# Patient Record
Sex: Male | Born: 2005 | Race: White | Hispanic: No | Marital: Single | Smoking: Never smoker
Health system: Southern US, Community
[De-identification: ages and names within clinical notes are randomized; demographics above are authoritative.]

## PROBLEM LIST (undated history)

## (undated) DIAGNOSIS — F840 Autistic disorder: Secondary | ICD-10-CM

## (undated) DIAGNOSIS — R569 Unspecified convulsions: Secondary | ICD-10-CM

## (undated) DIAGNOSIS — K5909 Other constipation: Secondary | ICD-10-CM

## (undated) HISTORY — DX: Other constipation: K59.09

## (undated) HISTORY — DX: Unspecified convulsions: R56.9

---
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
## 2024-02-05 ENCOUNTER — Encounter: Payer: Self-pay | Admitting: Emergency Medicine

## 2024-02-05 ENCOUNTER — Ambulatory Visit
Admission: EM | Admit: 2024-02-05 | Discharge: 2024-02-05 | Disposition: A | Discharge status: Home / Self Care | Payer: MEDICAID | Attending: Emergency Medicine | Admitting: Emergency Medicine

## 2024-02-05 DIAGNOSIS — J029 Acute pharyngitis, unspecified: Secondary | ICD-10-CM | POA: Diagnosis not present

## 2024-02-05 DIAGNOSIS — B349 Viral infection, unspecified: Secondary | ICD-10-CM

## 2024-02-05 LAB — POC SOFIA SARS ANTIGEN FIA: SARS Coronavirus 2 Ag: NEGATIVE

## 2024-02-05 LAB — POCT RAPID STREP A (OFFICE): Rapid Strep A Screen: NEGATIVE

## 2024-02-05 MED ORDER — BENZONATATE 100 MG PO CAPS: 100.0000 mg | ORAL_CAPSULE | Freq: Three times a day (TID) | ORAL | 0 refills | Status: DC

## 2024-02-05 MED ORDER — LIDOCAINE VISCOUS HCL 2 % MT SOLN: 15.0000 mL | OROMUCOSAL | 0 refills | Status: AC | PRN

## 2024-02-05 NOTE — Discharge Instructions (Signed)
 Your symptoms today are most likely being caused by a virus and should steadily improve in time it can take up to 7 to 10 days before you truly start to see a turnaround however things will get better  COVID and strep test negative  May gargle and spit lidocaine  solution every 4 hours as needed to provide temporary relief to the throat  May use Tessalon  pill every 8 hours as needed to help with cough    You can take Tylenol and/or Ibuprofen  as needed for fever reduction and pain relief.   For cough: honey 1/2 to 1 teaspoon (you can dilute the honey in water or another fluid).  You can also use guaifenesin and dextromethorphan for cough. You can use a humidifier for chest congestion and cough.  If you don't have a humidifier, you can sit in the bathroom with the hot shower running.      For sore throat: try warm salt water gargles, cepacol lozenges, throat spray, warm tea or water with lemon/honey, popsicles or ice, or OTC cold relief medicine for throat discomfort.   For congestion: take a daily anti-histamine like Zyrtec , Claritin, and a oral decongestant, such as pseudoephedrine.  You can also use Flonase 1-2 sprays in each nostril daily.   It is important to stay hydrated: drink plenty of fluids (water, gatorade/powerade/pedialyte, juices, or teas) to keep your throat moisturized and help further relieve irritation/discomfort.

## 2024-02-05 NOTE — ED Triage Notes (Signed)
 Patient complains non productive cough, clear nasal drainage  and sore throat x 2 days. Patient has taken anything for symptoms of then prescribe allergy medication Zyrtec . Rates pain 8 using the faces pain scale.

## 2024-02-05 NOTE — ED Provider Notes (Signed)
 Wesley [name]    CSN: [id number] Arrival date & time: 02/05/24  9191      History   Chief Complaint Chief Complaint  Patient presents with   Sore Throat   Cough   Nasal Congestion    HPI Wesley [name] is a 18 y.o. male.   Patient presents for evaluation of nasal congestion, nonproductive cough and sore throat present for 2 days.  Tolerating food and.  Denies fever, ear pain, shortness of breath or wheezing.  No known sick contacts prior.  Past Medical History:  Diagnosis Date   Autistic disorder    Chronic constipation    Seizures (HCC)    history but no longer requires medication    Patient Active Problem List   Diagnosis Date Noted   Well adolescent visit 10/13/2023   Gastritis 06/26/2023   History of seizure 04/14/2023   Cerumen impaction 04/14/2023   Autism spectrum disorder 05/13/2017   Developmental delay 05/13/2017    History reviewed. No pertinent surgical history.     Home Medications    Prior to Admission medications   Medication Sig Start Date End Date Taking? Authorizing Provider  benzonatate  (TESSALON ) 100 MG capsule Take 1 capsule (100 mg total) by mouth every 8 (eight) hours. 02/05/24  Yes Deontrae Drinkard R, NP  lidocaine  (XYLOCAINE ) 2 % solution Use as directed 15 mLs in the mouth or throat every 4 (four) hours as needed. 02/05/24  Yes Beverely Suen R, NP  cetirizine  HCl (ZYRTEC ) 5 MG/5ML SOLN Take 10 mLs (10 mg total) by mouth daily. 10/26/23   Rilla Baller, MD    Family History Family History  Problem Relation Age of Onset   Spinal muscular atrophy Cousin    Autism Neg Hx     Social History Social History   Tobacco Use   Smoking status: Never   Smokeless tobacco: Never  Vaping Use   Vaping status: Never Used  Substance Use Topics   Alcohol use: Never   Drug use: Never     Allergies   Patient has no known allergies.   Review of Systems Review of Systems   Physical Exam Triage Vital Signs ED Triage Vitals  [02/05/24 0834]  Encounter Vitals Group     BP 102/70     Girls Systolic BP Percentile      Girls Diastolic BP Percentile      Boys Systolic BP Percentile      Boys Diastolic BP Percentile      Pulse Rate 96     Resp 18     Temp 98.6 F (37 C)     Temp Source Oral     SpO2 97 %     Weight      Height      Head Circumference      Peak Flow      Pain Score      Pain Loc      Pain Education      Exclude from Growth Chart    No data found.  Updated Vital Signs BP 102/70 (BP Location: Right Arm)   Pulse 96   Temp 98.6 F (37 C) (Oral)   Resp 18   SpO2 97%   Visual Acuity Right Eye Distance:   Left Eye Distance:   Bilateral Distance:    Right Eye Near:   Left Eye Near:    Bilateral Near:     Physical Exam Constitutional:      Appearance: Normal appearance.  HENT:  Head: Normocephalic.     Right Ear: Tympanic membrane, ear canal and external ear normal.     Left Ear: Tympanic membrane and ear canal normal.     Nose: Congestion present.     Mouth/Throat:     Pharynx: Posterior oropharyngeal erythema present. No oropharyngeal exudate.  Eyes:     Extraocular Movements: Extraocular movements intact.  Cardiovascular:     Rate and Rhythm: Normal rate and regular rhythm.     Pulses: Normal pulses.     Heart sounds: Normal heart sounds.  Pulmonary:     Effort: Pulmonary effort is normal.     Breath sounds: Normal breath sounds.  Musculoskeletal:     Cervical back: Normal range of motion and neck supple.  Neurological:     Mental Status: He is alert and oriented to person, place, and time. Mental status is at baseline.      UC Treatments / Results  Labs (all labs ordered are listed, but only abnormal results are displayed) Labs Reviewed  POCT RAPID STREP A (OFFICE) - Normal  POC SOFIA SARS ANTIGEN FIA    EKG   Radiology No results found.  Procedures Procedures (including critical care time)  Medications Ordered in UC Medications - No data to  display  Initial Impression / Assessment and Plan / UC Course  I have reviewed the triage vital signs and the nursing notes.  Pertinent labs & imaging results that were available during my care of the patient were reviewed by me and considered in my medical decision making (see chart for details).  Viral illness, sore throat  Patient is in no signs of distress nor toxic appearing.  Vital signs are stable.  Low suspicion for pneumonia, pneumothorax or bronchitis and therefore will defer imaging.  COVID and strep test negative.  Prescribed viscous lidocaine  and Tessalon .May use additional over-the-counter medications as needed for supportive care.  May follow-up with urgent care as needed if symptoms persist or worsen.    Final Clinical Impressions(s) / UC Diagnoses   Final diagnoses:  Sore throat  Viral illness     Discharge Instructions      Your symptoms today are most likely being caused by a virus and should steadily improve in time it can take up to 7 to 10 days before you truly start to see a turnaround however things will get better  COVID and strep test negative  May gargle and spit lidocaine  solution every 4 hours as needed to provide temporary relief to the throat  May use Tessalon  pill every 8 hours as needed to help with cough    You can take Tylenol and/or Ibuprofen  as needed for fever reduction and pain relief.   For cough: honey 1/2 to 1 teaspoon (you can dilute the honey in water or another fluid).  You can also use guaifenesin and dextromethorphan for cough. You can use a humidifier for chest congestion and cough.  If you don't have a humidifier, you can sit in the bathroom with the hot shower running.      For sore throat: try warm salt water gargles, cepacol lozenges, throat spray, warm tea or water with lemon/honey, popsicles or ice, or OTC cold relief medicine for throat discomfort.   For congestion: take a daily anti-histamine like Zyrtec , Claritin, and a  oral decongestant, such as pseudoephedrine.  You can also use Flonase 1-2 sprays in each nostril daily.   It is important to stay hydrated: drink plenty of fluids (water, gatorade/powerade/pedialyte, juices,  or teas) to keep your throat moisturized and help further relieve irritation/discomfort.    ED Prescriptions     Medication Sig Dispense Auth. Provider   lidocaine  (XYLOCAINE ) 2 % solution Use as directed 15 mLs in the mouth or throat every 4 (four) hours as needed. 100 mL Tyshun Tuckerman R, NP   benzonatate  (TESSALON ) 100 MG capsule Take 1 capsule (100 mg total) by mouth every 8 (eight) hours. 21 capsule [name] Hamm, Shelba SAUNDERS, NP      PDMP not reviewed this encounter.   Teresa Shelba SAUNDERS, [place] 02/05/24 [phone]

## 2024-03-16 ENCOUNTER — Ambulatory Visit (INDEPENDENT_AMBULATORY_CARE_PROVIDER_SITE_OTHER): Payer: MEDICAID

## 2024-03-16 DIAGNOSIS — Z23 Encounter for immunization: Secondary | ICD-10-CM

## 2024-03-24 ENCOUNTER — Other Ambulatory Visit: Payer: Self-pay

## 2024-03-24 ENCOUNTER — Emergency Department
Admission: EM | Admit: 2024-03-24 | Discharge: 2024-03-24 | Disposition: A | Discharge status: Home / Self Care | Payer: MEDICAID | Source: Other Acute Inpatient Hospital | Attending: Emergency Medicine | Admitting: Emergency Medicine

## 2024-03-24 ENCOUNTER — Emergency Department: Payer: MEDICAID

## 2024-03-24 DIAGNOSIS — R1030 Lower abdominal pain, unspecified: Secondary | ICD-10-CM | POA: Diagnosis present

## 2024-03-24 DIAGNOSIS — K5641 Fecal impaction: Secondary | ICD-10-CM | POA: Diagnosis not present

## 2024-03-24 LAB — URINALYSIS, ROUTINE W REFLEX MICROSCOPIC
Bilirubin Urine: NEGATIVE
Glucose, UA: NEGATIVE mg/dL
Hgb urine dipstick: NEGATIVE
Ketones, ur: 20 mg/dL — AB
Leukocytes,Ua: NEGATIVE
Nitrite: NEGATIVE
Protein, ur: 100 mg/dL — AB
Specific Gravity, Urine: 1.031 — ABNORMAL HIGH (ref 1.005–1.030)
Squamous Epithelial / HPF: 0 /HPF (ref 0–5)
pH: 5 (ref 5.0–8.0)

## 2024-03-24 MED ORDER — POLYETHYLENE GLYCOL 3350 17 G PO PACK
17.0000 g | PACK | Freq: Once | ORAL | Status: AC
  Administered 2024-03-24: 17 g via ORAL
  Filled 2024-03-24: qty 1

## 2024-03-24 MED ORDER — FAMOTIDINE 20 MG PO TABS: 20.0000 mg | ORAL_TABLET | Freq: Two times a day (BID) | ORAL | 0 refills | Status: DC

## 2024-03-24 MED ORDER — ONDANSETRON 4 MG PO TBDP: 4.0000 mg | ORAL_TABLET | Freq: Three times a day (TID) | ORAL | 0 refills | Status: AC | PRN

## 2024-03-24 MED ORDER — SMOG ENEMA
960.0000 mL | Freq: Once | RECTAL | Status: AC
  Administered 2024-03-24: 960 mL via RECTAL
  Filled 2024-03-24: qty 960

## 2024-03-24 NOTE — ED Provider Notes (Signed)
 [facility] Emergency Department Provider Note     Event Date/Time   First MD Initiated Contact with Patient 03/24/24 1510     (approximate)   History   Abdominal Pain   HPI  Wesley [name] is a 18 y.o. male with a history of autism spectrum disorder, developmental delay, chronic constipation, and seizure disorder who presents to the ED endorsing abdominal pain with associated vomiting since Monday.  Patient presents from [facility] for evaluation of symptoms.  He is a resident of a local group home and presents with a caregiver.  No reports of any fevers, chills, sweats.  Physical Exam   Triage Vital Signs: ED Triage Vitals  Encounter Vitals Group     BP 03/24/24 1445 123/89     Girls Systolic BP Percentile --      Girls Diastolic BP Percentile --      Boys Systolic BP Percentile --      Boys Diastolic BP Percentile --      Pulse Rate 03/24/24 1445 93     Resp 03/24/24 1445 18     Temp 03/24/24 1445 98.4 F (36.9 C)     Temp Source 03/24/24 1445 Oral     SpO2 03/24/24 1445 96 %     Weight 03/24/24 1444 101 lb (45.8 kg)     Height 03/24/24 1444 5' 2 (1.575 m)     Head Circumference --      Peak Flow --      Pain Score 03/24/24 1443 8     Pain Loc --      Pain Education --      Exclude from Growth Chart --     Most recent vital signs: Vitals:   03/24/24 1445 03/24/24 1904  BP: 123/89 135/88  Pulse: 93 (!) 107  Resp: 18 16  Temp: 98.4 F (36.9 C) 99.5 F (37.5 C)  SpO2: 96% 99%    General Awake, no distress. NAD HEENT NCAT. PERRL. EOMI. No rhinorrhea. Mucous membranes are moist.  CV:  Good peripheral perfusion.  RESP:  Normal effort.  ABD:  No distention.  Flat, soft and nontender.  No rebound, guarding, or rigidity noted.  Normoactive bowel sounds x 4.   ED Results / Procedures / Treatments   Labs (all labs ordered are listed, but only abnormal results are displayed) Labs Reviewed  URINALYSIS, ROUTINE W REFLEX MICROSCOPIC -  Abnormal; Notable for the following components:      Result Value   Color, Urine AMBER (*)    APPearance HAZY (*)    Specific Gravity, Urine 1.031 (*)    Ketones, ur 20 (*)    Protein, ur 100 (*)    Bacteria, UA RARE (*)    All other components within normal limits     EKG   RADIOLOGY  DG Abdomen 1 View Result Date: 03/24/2024 CLINICAL DATA:  Constipation. EXAM: ABDOMEN - 1 VIEW COMPARISON:  February 01, 2008. FINDINGS: No small bowel dilatation is noted. Moderate colonic dilatation is noted that appears to be due to a large amount of stool in the descending and sigmoid colon and rectum concerning for impaction. IMPRESSION: Moderate colonic dilatation is noted that appears to be due to a large amount of stool in the descending and sigmoid colon and rectum concerning for impaction. Electronically Signed   By: Lynwood Landy Raddle M.D.   On: 03/24/2024 16:27     PROCEDURES:  Critical Care performed: No  Procedures   MEDICATIONS  ORDERED IN ED: Medications  polyethylene glycol (MIRALAX / GLYCOLAX) packet 17 g (17 g Oral Given 03/24/24 1708)  sorbitol, magnesium hydroxide, mineral oil, glycerin (SMOG) enema (960 mLs Rectal Given 03/24/24 1812)     IMPRESSION / MDM / ASSESSMENT AND PLAN / ED COURSE  I reviewed the triage vital signs and the nursing notes.                              Differential diagnosis includes, but is not limited to, acute appendicitis, renal colic, testicular torsion, urinary tract infection/pyelonephritis, prostatitis,  epididymitis, diverticulitis, small bowel obstruction or ileus, colitis, abdominal aortic aneurysm, gastroenteritis, hernia, etc.   Patient's presentation is most consistent with acute complicated illness / injury requiring diagnostic workup.  Patient's diagnosis is consistent with rectal fecal impaction.  Patient with reassuring exam and workup, was found to have a large stool burden primarily in the rectum on upright x-rays interpreted by me.   We discussed treatment options with the caregiver including manual disimpaction and bedside enema.  Manual exam revealed soft stool high in the rectal vault.  Bedside smog enema was successful in providing me a full evacuation of the rectum.  Patient endorses improved symptoms at this time.  Patient will be discharged home with prescriptions for omeprazole and Zofran .  Patient is encouraged to proceed with daily MiraLAX to promote regular stools.  He is also advised to increase his dietary fiber intake.  Patient is to follow up with PCP as needed or otherwise directed. Patient is given ED precautions to return to the ED for any worsening or new symptoms.  FINAL CLINICAL IMPRESSION(S) / ED DIAGNOSES   Final diagnoses:  Fecal impaction in rectum (HCC)  Lower abdominal pain     Rx / DC Orders   ED Discharge Orders          Ordered    ondansetron  (ZOFRAN -ODT) 4 MG disintegrating tablet  Every 8 hours PRN        03/24/24 1551    famotidine  (PEPCID ) 20 MG tablet  2 times daily        03/24/24 1551             Note:  This document was prepared using Dragon voice recognition software and may include unintentional dictation errors.    Loyd Candida LULLA Aldona, PA-C 03/24/24 1916    Arlander Charleston, MD 03/26/24 0730

## 2024-03-24 NOTE — ED Notes (Addendum)
 This RN spoke to legal guardian Alm Ghazi about pt's visit in the ED and discharge back to group home with care giver. Legal guardian verbalized understanding.

## 2024-03-24 NOTE — ED Triage Notes (Signed)
 Patient sent over from [facility] for further workup of abdominal pain and vomiting since Monday. Patient is a resident of [facility] and here with caregiver.

## 2024-03-24 NOTE — Discharge Instructions (Addendum)
 Wesley [name] has a reassuring exam and workup at this time.  His x-ray showed a large rectal fecal impaction.  That impaction has been cleared with an enema.  Continue to dose daily MiraLAX as a stool softener, to promote regular bowel movements.  Increase fiber intake as well.  Follow-up with the primary provider for ongoing evaluation.  Return to the ED if needed.

## 2024-04-25 ENCOUNTER — Ambulatory Visit: Payer: Self-pay

## 2024-04-25 NOTE — Telephone Encounter (Signed)
 FYI Only or Action Required?: FYI only for provider: appointment scheduled on 11/7.  Patient was last seen in primary care on 10/13/2023 by Rilla Baller, MD.  Called Nurse Triage reporting Abdominal Pain.  Symptoms began several months ago.  Interventions attempted: Prescription medications: Pepcid .  Symptoms are: gradually worsening.  Triage Disposition: See Physician Within 24 Hours  Patient/caregiver understands and will follow disposition?: No- request appt 11/7  Copied from CRM #[id number]. Topic: Clinical - Red Word Triage >> Apr 25, 2024 10:07 AM Winona R wrote: Stomach pain off and on since April. Become worst went to the ER about a month ago but pt would not allow clinical staff to draw blood. Group home staff [facility] the line to schedule,  however this is a red word(pain) and it is also getting worst. Reason for Disposition  [1] MODERATE pain (e.g., interferes with normal activities) AND [2] pain comes and goes (cramps) AND [3] present > 24 hours  (Exception: Pain with Vomiting or Diarrhea - see that Guideline.)  Answer Assessment - Initial Assessment Questions Ongoing abdominal pain issues since at least April when he got to group home. Localized abdominal pain to the left lower abdomen. Denies radiating. Intermittent and bad enough pt has asked to see his doctor.  Spoke with group home staff, DeeDeeLactose intolerant per the patient- but family did not corroborate.  He does not eat a lactose free diet.  Patient has had intermittent vomiting but has not occurred in a few weeks. Has never eaten a whole lot. Patient does get constipation but concerned for other causes. ED a month ago for fecal impaction. Vitals have been great with no fever. He is currently having regular BMs  Appt made per request of home for Friday 11/7. ED/UC/Call back precautions given and understood.   1. LOCATION: Where does it hurt?      Left lower abdomen  2. RADIATION: Does the pain shoot  anywhere else? (e.g., chest, back)     denies 3. ONSET: When did the pain begin? (Minutes, hours or days ago)      Patient got to group home in April but progressively getting worse 4. SUDDEN: Gradual or sudden onset?     Gradually getting worse 5. PATTERN Does the pain come and go, or is it constant?     Coming and going but gradually getting worse 6. SEVERITY: How bad is the pain?  (e.g., Scale 1-10; mild, moderate, or severe)     7/10 7. RECURRENT SYMPTOM: Have you ever had this type of stomach pain before? If Yes, ask: When was the last time? and What happened that time?      Ongoing since before he got to group home in April, progressive  8. CAUSE: What do you think is causing the stomach pain? (e.g., gallstones, recent abdominal surgery)     Unsure- has chronic constipation  9. RELIEVING/AGGRAVATING FACTORS: What makes it better or worse? (e.g., antacids, bending or twisting motion, bowel movement)     Pepcid  prescribed in the ED- no real benefit 10. OTHER SYMPTOMS: Do you have any other symptoms? (e.g., back pain, diarrhea, fever, urination pain, vomiting)       Vomiting- has been a little while but when pain gets severe he does.  Protocols used: Abdominal Pain - Male-A-AH

## 2024-04-29 ENCOUNTER — Ambulatory Visit (INDEPENDENT_AMBULATORY_CARE_PROVIDER_SITE_OTHER): Payer: MEDICAID | Admitting: General Practice

## 2024-04-29 ENCOUNTER — Encounter: Payer: Self-pay | Admitting: General Practice

## 2024-04-29 VITALS — BP 116/72 | HR 90 | Temp 98.7°F | Ht 62.02 in | Wt 109.0 lb

## 2024-04-29 DIAGNOSIS — K5909 Other constipation: Secondary | ICD-10-CM | POA: Insufficient documentation

## 2024-04-29 DIAGNOSIS — K5900 Constipation, unspecified: Secondary | ICD-10-CM | POA: Insufficient documentation

## 2024-04-29 NOTE — Progress Notes (Signed)
 Established Patient Office Visit  Subjective   Patient ID: Wesley [name], male    DOB: 2005/12/01  Age: 18 y.o. MRN: [id number]  Chief Complaint  Patient presents with   Abdominal Pain    Possibly constipated    Abdominal Pain Pertinent negatives include no anxiety, constipation, diarrhea, dysuria, fever, frequency, headaches, nausea or vomiting.    Wesley [name] is a 18 year old male, patient of Dr. Rilla, presents today for an acute visit.  His dad is also present today.   Discussed the use of AI scribe software for clinical note transcription with the patient, who gave verbal consent to proceed.  History of Present Illness Wesley [name] is a 18 year old male who presents with abdominal pain and constipation. He is accompanied by his dad, who is his legal guardian. Patient resides at a group-home.   He has been experiencing abdominal pain and constipation, which began last month following an episode of diarrhea. The diarrhea lasted a few days and resulted in accidents at his group home. He was evaluated at a clinic on 03/24/24 and found to be constipated, receiving an enema that temporarily resolved the issue.  Recently, he has not had a bowel movement for a few days.  He is able to pass gas.  He does not usually take any medications consistently.  He resides in a group home setting 24/7, where he receives care and supervision.     Patient Active Problem List   Diagnosis Date Noted   Well adolescent visit 10/13/2023   Gastritis 06/26/2023   History of seizure 04/14/2023   Cerumen impaction 04/14/2023   Autism spectrum disorder 05/13/2017   Developmental delay 05/13/2017   Past Medical History:  Diagnosis Date   Autistic disorder    Chronic constipation    Seizures (HCC)    history but no longer requires medication   History reviewed. No pertinent surgical history. No Known Allergies       10/13/2023    4:01 PM  Depression screen PHQ 2/9  Decreased Interest 0   Down, Depressed, Hopeless 0  PHQ - 2 Score 0  Altered sleeping 1  Tired, decreased energy 1  Change in appetite 1  Feeling bad or failure about yourself  0  Trouble concentrating 0  Moving slowly or fidgety/restless 0  Suicidal thoughts 0  PHQ-9 Score 3   Difficult doing work/chores Not difficult at all     Data saved with a previous flowsheet row definition       10/13/2023    4:01 PM  GAD 7 : Generalized Anxiety Score  Nervous, Anxious, on Edge 0  Control/stop worrying 0  Worry too much - different things 1  Trouble relaxing 0  Restless 0  Easily annoyed or irritable 1  Afraid - awful might happen 1  Total GAD 7 Score 3  Anxiety Difficulty Somewhat difficult      Review of Systems  Constitutional:  Negative for chills and fever.  Respiratory:  Negative for shortness of breath.   Cardiovascular:  Negative for chest pain.  Gastrointestinal:  Positive for abdominal pain. Negative for constipation, diarrhea, heartburn, nausea and vomiting.  Genitourinary:  Negative for dysuria, frequency and urgency.  Neurological:  Negative for dizziness and headaches.  Endo/Heme/Allergies:  Negative for polydipsia.  Psychiatric/Behavioral:  Negative for depression and suicidal ideas. The patient is not nervous/anxious.       Objective:     BP 116/72 (BP Location: Left Arm, Patient Position: Sitting, Cuff Size:  Normal)   Pulse 90   Temp 98.7 F (37.1 C) (Temporal)   SpO2 98%  BP Readings from Last 3 Encounters:  04/29/24 116/72  03/24/24 135/88  02/05/24 102/70   Wt Readings from Last 3 Encounters:  03/24/24 101 lb (45.8 kg) (<1%, Z= -3.18)*  10/13/23 109 lb 2 oz (49.5 kg) (<1%, Z= -2.37)*  06/26/23 106 lb (48.1 kg) (<1%, Z= -2.54)*   * Growth percentiles are based on CDC (Boys, 2-20 Years) data.      Physical Exam Vitals and nursing note reviewed.  Constitutional:      Appearance: Normal appearance.  Cardiovascular:     Rate and Rhythm: Normal rate and regular  rhythm.     Pulses: Normal pulses.     Heart sounds: Normal heart sounds.  Pulmonary:     Effort: Pulmonary effort is normal.     Breath sounds: Normal breath sounds.  Abdominal:     General: Abdomen is flat. Bowel sounds are normal.     Palpations: Abdomen is soft.  Neurological:     Mental Status: He is alert and oriented to person, place, and time.  Psychiatric:        Mood and Affect: Mood normal.        Behavior: Behavior normal.        Thought Content: Thought content normal.        Judgment: Judgment normal.      No results found for any visits on 04/29/24.     The ASCVD Risk score (Arnett DK, et al., 2019) failed to calculate for the following reasons:   The 2019 ASCVD risk score is only valid for ages 62 to 21    Assessment & Plan:  There are no diagnoses linked to this encounter.   Assessment and Plan Assessment & Plan Constipation Recurrent constipation with recent abdominal pain and lack of bowel movement. Low fiber intake may contribute. - Administer Fleet enema at home if immediate relief is needed. - Start Colace 100 mg, two capsules in the morning and two at night daily. - Administer Miralax once daily mixed with water. - Ensure adequate hydration with 64 ounces of water daily. - Increase dietary fiber intake with foods like oatmeal, apples, and bran flakes. - Consider Dulcolax tablets if enema is not preferred. - If abdominal pain persists after bowel movement, consider x-ray.    Return if symptoms worsen or fail to improve.    Carrol Aurora, NP

## 2024-04-29 NOTE — Patient Instructions (Addendum)
 Please start Colace 100 mg 2 capsules in the morning and 2 at bedtime.  Miralax once daily.  Ensure that you are drinking 64 ounces of water daily.  Increase fiber intake.   Update me by Monday if you are not better and then I will order the x-ray.   It was a pleasure to see you today!

## 2024-05-17 ENCOUNTER — Ambulatory Visit
Admission: EM | Admit: 2024-05-17 | Discharge: 2024-05-17 | Disposition: A | Discharge status: Home / Self Care | Payer: MEDICAID | Attending: Emergency Medicine | Admitting: Emergency Medicine

## 2024-05-17 DIAGNOSIS — H6123 Impacted cerumen, bilateral: Secondary | ICD-10-CM

## 2024-05-17 NOTE — ED Triage Notes (Signed)
 Patient to Urgent Care with complaints of left sided ear fullness. No pain.   Symptoms started Saturday.

## 2024-05-17 NOTE — Discharge Instructions (Addendum)
 Follow up with your primary care provider as needed.

## 2024-05-17 NOTE — ED Provider Notes (Signed)
 Wesley [name]    CSN: [id number] Arrival date & time: 05/17/24  9082      History   Chief Complaint Chief Complaint  Patient presents with   Ear Fullness    HPI Wesley [name] is a 18 y.o. male.  Accompanied by a caregiver, patient presents with 3-day history of decreased hearing and fullness in his left ear.  He denies fever, chills, ear drainage, ear pain, sore throat, cough, shortness of breath.  No treatments attempted.  The history is provided by the patient and medical records.    Past Medical History:  Diagnosis Date   Autistic disorder    Chronic constipation    Seizures (HCC)    history but no longer requires medication    Patient Active Problem List   Diagnosis Date Noted   Constipation 04/29/2024   Well adolescent visit 10/13/2023   Gastritis 06/26/2023   History of seizure 04/14/2023   Cerumen impaction 04/14/2023   Autism spectrum disorder 05/13/2017   Developmental delay 05/13/2017    History reviewed. No pertinent surgical history.     Home Medications    Prior to Admission medications   Medication Sig Start Date End Date Taking? Authorizing Provider  cetirizine  HCl (ZYRTEC ) 5 MG/5ML SOLN Take 10 mLs (10 mg total) by mouth daily. Patient not taking: Reported on 04/29/2024 10/26/23   Rilla Baller, MD  famotidine  (PEPCID ) 20 MG tablet Take 1 tablet (20 mg total) by mouth 2 (two) times daily. Patient not taking: Reported on 05/17/2024 03/24/24 04/29/24  Menshew, Candida LULLA Kings, PA-C  lidocaine  (XYLOCAINE ) 2 % solution Use as directed 15 mLs in the mouth or throat every 4 (four) hours as needed. Patient not taking: Reported on 05/17/2024 02/05/24   Teresa Shelba SAUNDERS, NP  ondansetron  (ZOFRAN -ODT) 4 MG disintegrating tablet Take 1 tablet (4 mg total) by mouth every 8 (eight) hours as needed for nausea or vomiting. Patient not taking: Reported on 05/17/2024 03/24/24   Menshew, Candida LULLA Kings, PA-C    Family History Family History  Problem  Relation Age of Onset   Spinal muscular atrophy Cousin    Autism Neg Hx     Social History Social History   Tobacco Use   Smoking status: Never   Smokeless tobacco: Never  Vaping Use   Vaping status: Never Used  Substance Use Topics   Alcohol use: Never   Drug use: Never     Allergies   Patient has no known allergies.   Review of Systems Review of Systems  Constitutional:  Negative for chills and fever.  HENT:  Positive for hearing loss. Negative for ear discharge, ear pain and sore throat.   Respiratory:  Negative for cough and shortness of breath.      Physical Exam Triage Vital Signs ED Triage Vitals  Encounter Vitals Group     BP 05/17/24 0929 100/60     Girls Systolic BP Percentile --      Girls Diastolic BP Percentile --      Boys Systolic BP Percentile --      Boys Diastolic BP Percentile --      Pulse Rate 05/17/24 0929 78     Resp 05/17/24 0929 18     Temp 05/17/24 0929 98.2 F (36.8 C)     Temp src --      SpO2 05/17/24 0929 98 %     Weight --      Height --      Head Circumference --  Peak Flow --      Pain Score 05/17/24 0931 0     Pain Loc --      Pain Education --      Exclude from Growth Chart --    No data found.  Updated Vital Signs BP 100/60   Pulse 78   Temp 98.2 F (36.8 C)   Resp 18   SpO2 98%   Visual Acuity Right Eye Distance:   Left Eye Distance:   Bilateral Distance:    Right Eye Near:   Left Eye Near:    Bilateral Near:     Physical Exam Constitutional:      General: He is not in acute distress. HENT:     Right Ear: There is impacted cerumen.     Left Ear: There is impacted cerumen.     Ears:     Comments: Cerumen removed via irrigation by RN.  Both TMs noted to be clear after cerumen removal.    Nose: Nose normal.     Mouth/Throat:     Mouth: Mucous membranes are moist.     Pharynx: Oropharynx is clear.  Cardiovascular:     Rate and Rhythm: Normal rate and regular rhythm.     Heart sounds: Normal  heart sounds.  Pulmonary:     Effort: Pulmonary effort is normal. No respiratory distress.     Breath sounds: Normal breath sounds.  Neurological:     Mental Status: He is alert.      UC Treatments / Results  Labs (all labs ordered are listed, but only abnormal results are displayed) Labs Reviewed - No data to display  EKG   Radiology No results found.  Procedures Procedures (including critical care time)  Medications Ordered in UC Medications - No data to display  Initial Impression / Assessment and Plan / UC Course  I have reviewed the triage vital signs and the nursing notes.  Pertinent labs & imaging results that were available during my care of the patient were reviewed by me and considered in my medical decision making (see chart for details).    Bilateral cerumen impaction.  Afebrile and vital signs are stable.  Cerumen removed via irrigation by RN.  Both TMs noted to be clear after cerumen removal.  Patient reports relief of his symptoms.  Education provided on earwax buildup.  Instructed patient and his caregiver to follow-up with his PCP as needed.  They agree to plan of care.  Final Clinical Impressions(s) / UC Diagnoses   Final diagnoses:  Bilateral impacted cerumen     Discharge Instructions      Follow-up with your primary care provider as needed.      ED Prescriptions   None    PDMP not reviewed this encounter.   Corlis Burnard DEL, NP 05/17/24 1009

## 2024-05-18 ENCOUNTER — Ambulatory Visit: Payer: MEDICAID | Admitting: Family Medicine

## 2024-07-18 ENCOUNTER — Ambulatory Visit: Payer: MEDICAID | Admitting: Family Medicine

## 2024-07-20 ENCOUNTER — Ambulatory Visit: Payer: MEDICAID | Admitting: Family Medicine

## 2024-07-22 ENCOUNTER — Encounter: Payer: Self-pay | Admitting: Family Medicine

## 2024-07-22 ENCOUNTER — Ambulatory Visit (INDEPENDENT_AMBULATORY_CARE_PROVIDER_SITE_OTHER): Payer: MEDICAID | Admitting: Family Medicine

## 2024-07-22 VITALS — BP 90/68 | HR 80 | Temp 99.2°F | Ht 64.5 in | Wt 116.0 lb

## 2024-07-22 DIAGNOSIS — K5909 Other constipation: Secondary | ICD-10-CM

## 2024-07-22 DIAGNOSIS — R1084 Generalized abdominal pain: Secondary | ICD-10-CM | POA: Insufficient documentation

## 2024-07-22 MED ORDER — ACETAMINOPHEN 160 MG/5ML PO ELIX: 500.0000 mg | ORAL_SOLUTION | Freq: Four times a day (QID) | ORAL | 11 refills | Status: AC | PRN

## 2024-07-22 NOTE — Assessment & Plan Note (Signed)
 Chronic, due to poor water and fiber intake. Encouraged increasing water and fiber in diet and discussed ways to do so. Recommended MiraLAX  17 g daily and discussed ways to hide any flavor. Encouraged him to do physical activity as tolerated to stimulate bowels.  If not improving follow-up with PCP.

## 2024-07-22 NOTE — Progress Notes (Signed)
 "   Patient ID: Wesley [name], male    DOB: 04-08-2006, 19 y.o.   MRN: [id number]  This visit was conducted in person.  BP 90/68   Pulse 80   Temp 99.2 F (37.3 C) (Temporal)   Ht 5' 4.5 (1.638 m)   Wt 116 lb (52.6 kg)   SpO2 96%   BMI 19.60 kg/m    CC:  Chief Complaint  Patient presents with   Constipation    Trouble taking Metamucil   Abdominal Pain    Subjective:   HPI: Wesley [name] is a 19 y.o. male presenting on 07/22/2024 for Constipation (Trouble taking Metamucil) and Abdominal Pain   PCP Rilla  He has had intermittent abdominal pain,  he is pooping daily, has to strain. No blood in stool.  Seen in ER in 02/2024 for similar issues.  Has history of constipation.... not  taking metamucil or miralax  given hard to take.  Minimal water intake.. does try flavor packets.   Does not like fruits and veggies... trying to start eating grape, applesauce   Senekot  and dulcolax caused upset stomach.    Relevant past medical, surgical, family and social history reviewed and updated as indicated. Interim medical history since our last visit reviewed. Allergies and medications reviewed and updated. Outpatient Medications Prior to Visit  Medication Sig Dispense Refill   cetirizine  HCl (ZYRTEC ) 5 MG/5ML SOLN Take 10 mLs (10 mg total) by mouth daily. 300 mL 11   lidocaine  (XYLOCAINE ) 2 % solution Use as directed 15 mLs in the mouth or throat every 4 (four) hours as needed. 100 mL 0   ondansetron  (ZOFRAN -ODT) 4 MG disintegrating tablet Take 1 tablet (4 mg total) by mouth every 8 (eight) hours as needed for nausea or vomiting. 15 tablet 0   famotidine  (PEPCID ) 20 MG tablet Take 1 tablet (20 mg total) by mouth 2 (two) times daily. (Patient not taking: Reported on 05/17/2024) 60 tablet 0   No facility-administered medications prior to visit.     Per HPI unless specifically indicated in ROS section below Review of Systems  Constitutional:  Negative for fatigue and fever.  HENT:   Negative for ear pain.   Eyes:  Negative for pain.  Respiratory:  Negative for cough and shortness of breath.   Cardiovascular:  Negative for chest pain, palpitations and leg swelling.  Gastrointestinal:  Positive for constipation. Negative for abdominal pain, anal bleeding, blood in stool, diarrhea, nausea, rectal pain and vomiting.  Genitourinary:  Negative for dysuria.  Musculoskeletal:  Negative for arthralgias.  Neurological:  Negative for syncope, light-headedness and headaches.  Psychiatric/Behavioral:  Negative for dysphoric mood.    Objective:  BP 90/68   Pulse 80   Temp 99.2 F (37.3 C) (Temporal)   Ht 5' 4.5 (1.638 m)   Wt 116 lb (52.6 kg)   SpO2 96%   BMI 19.60 kg/m   Wt Readings from Last 3 Encounters:  07/22/24 116 lb (52.6 kg) (2%, Z= -2.01)*  04/29/24 109 lb (49.4 kg) (<1%, Z= -2.50)*  03/24/24 101 lb (45.8 kg) (<1%, Z= -3.18)*   * Growth percentiles are based on CDC (Boys, 2-20 Years) data.      Physical Exam Vitals reviewed.  Constitutional:      Appearance: He is well-developed.  HENT:     Head: Normocephalic.     Right Ear: Hearing normal.     Left Ear: Hearing normal.     Nose: Nose normal.  Neck:     Thyroid:  No thyroid mass or thyromegaly.     Vascular: No carotid bruit.     Trachea: Trachea normal.  Cardiovascular:     Rate and Rhythm: Normal rate and regular rhythm.     Pulses: Normal pulses.     Heart sounds: Heart sounds not distant. No murmur heard.    No friction rub. No gallop.     Comments: No peripheral edema Pulmonary:     Effort: Pulmonary effort is normal. No respiratory distress.     Breath sounds: Normal breath sounds.  Abdominal:     General: Abdomen is flat. Bowel sounds are normal. There is no distension.     Palpations: Abdomen is soft.     Tenderness: There is no abdominal tenderness. There is no right CVA tenderness or left CVA tenderness.     Hernia: No hernia is present.  Skin:    General: Skin is warm and dry.      Findings: No rash.  Psychiatric:        Speech: Speech normal.        Behavior: Behavior normal.        Thought Content: Thought content normal.       Results for orders placed or performed during the hospital encounter of 03/24/24  Urinalysis, Routine w reflex microscopic -Urine, Random   Collection Time: 03/24/24  2:48 PM  Result Value Ref Range   Color, Urine AMBER (A) YELLOW   APPearance HAZY (A) CLEAR   Specific Gravity, Urine 1.031 (H) 1.005 - 1.030   pH 5.0 5.0 - 8.0   Glucose, UA NEGATIVE NEGATIVE mg/dL   Hgb urine dipstick NEGATIVE NEGATIVE   Bilirubin Urine NEGATIVE NEGATIVE   Ketones, ur 20 (A) NEGATIVE mg/dL   Protein, ur 899 (A) NEGATIVE mg/dL   Nitrite NEGATIVE NEGATIVE   Leukocytes,Ua NEGATIVE NEGATIVE   RBC / HPF 0-5 0 - 5 RBC/hpf   WBC, UA 0-5 0 - 5 WBC/hpf   Bacteria, UA RARE (A) NONE SEEN   Squamous Epithelial / HPF 0 0 - 5 /HPF   Mucus PRESENT     Assessment and Plan  Chronic constipation Assessment & Plan: Chronic, due to poor water and fiber intake. Encouraged increasing water and fiber in diet and discussed ways to do so. Recommended MiraLAX  17 g daily and discussed ways to hide any flavor. Encouraged him to do physical activity as tolerated to stimulate bowels.  If not improving follow-up with PCP.   Generalized abdominal pain Assessment & Plan: Chronic, likely related to chronic constipation.  No abdominal pain on exam today.  No red flags or indication for additional imaging.  If severe pain or persistent symptoms follow-up with PCP.   Other orders -     Acetaminophen ; Take 15.6 mLs (500 mg total) by mouth every 6 (six) hours as needed for fever.  Dispense: 120 mL; Refill: 11    No follow-ups on file.   Greig Ring, MD  "

## 2024-07-22 NOTE — Assessment & Plan Note (Signed)
 Chronic, likely related to chronic constipation.  No abdominal pain on exam today.  No red flags or indication for additional imaging.  If severe pain or persistent symptoms follow-up with PCP.

## 2024-09-26 ENCOUNTER — Ambulatory Visit: Payer: MEDICAID | Admitting: Family Medicine

## 2024-10-14 ENCOUNTER — Encounter: Payer: MEDICAID | Admitting: Family Medicine

## 2024-10-19 ENCOUNTER — Encounter: Payer: MEDICAID | Admitting: Family Medicine
